# Patient Record
Sex: Female | Born: 1965 | Race: White | Hispanic: No | Marital: Married | Smoking: Never smoker
Health system: Southern US, Community
[De-identification: ages and names within clinical notes are randomized; demographics above are authoritative.]

## PROBLEM LIST (undated history)

## (undated) DIAGNOSIS — R32 Unspecified urinary incontinence: Secondary | ICD-10-CM

## (undated) HISTORY — PX: ABDOMINAL EXPLORATION SURGERY: SHX538

---

## 1999-09-16 ENCOUNTER — Other Ambulatory Visit: Admission: RE | Admit: 1999-09-16 | Discharge: 1999-09-16 | Payer: Self-pay | Admitting: Obstetrics and Gynecology

## 2000-10-12 ENCOUNTER — Other Ambulatory Visit: Admission: RE | Admit: 2000-10-12 | Discharge: 2000-10-12 | Payer: Self-pay | Admitting: Obstetrics and Gynecology

## 2001-11-11 ENCOUNTER — Other Ambulatory Visit: Admission: RE | Admit: 2001-11-11 | Discharge: 2001-11-11 | Payer: Self-pay | Admitting: Obstetrics and Gynecology

## 2002-12-09 ENCOUNTER — Other Ambulatory Visit: Admission: RE | Admit: 2002-12-09 | Discharge: 2002-12-09 | Payer: Self-pay | Admitting: Obstetrics and Gynecology

## 2003-12-27 ENCOUNTER — Other Ambulatory Visit: Admission: RE | Admit: 2003-12-27 | Discharge: 2003-12-27 | Payer: Self-pay | Admitting: Obstetrics and Gynecology

## 2005-02-24 ENCOUNTER — Other Ambulatory Visit: Admission: RE | Admit: 2005-02-24 | Discharge: 2005-02-24 | Payer: Self-pay | Admitting: Obstetrics and Gynecology

## 2007-07-06 ENCOUNTER — Encounter: Admission: RE | Admit: 2007-07-06 | Discharge: 2007-07-06 | Payer: Self-pay | Admitting: Obstetrics and Gynecology

## 2007-12-28 ENCOUNTER — Encounter: Admission: RE | Admit: 2007-12-28 | Discharge: 2007-12-28 | Payer: Self-pay | Admitting: Obstetrics and Gynecology

## 2013-09-14 ENCOUNTER — Other Ambulatory Visit: Payer: Self-pay | Admitting: Obstetrics and Gynecology

## 2013-09-14 DIAGNOSIS — R928 Other abnormal and inconclusive findings on diagnostic imaging of breast: Secondary | ICD-10-CM

## 2013-09-29 ENCOUNTER — Ambulatory Visit
Admission: RE | Admit: 2013-09-29 | Discharge: 2013-09-29 | Disposition: A | Discharge status: Home / Self Care | Payer: 59 | Source: Ambulatory Visit | Attending: Obstetrics and Gynecology | Admitting: Obstetrics and Gynecology

## 2013-09-29 DIAGNOSIS — R928 Other abnormal and inconclusive findings on diagnostic imaging of breast: Secondary | ICD-10-CM

## 2013-10-03 ENCOUNTER — Other Ambulatory Visit: Payer: Self-pay

## 2014-03-28 ENCOUNTER — Encounter (HOSPITAL_BASED_OUTPATIENT_CLINIC_OR_DEPARTMENT_OTHER): Payer: Self-pay | Admitting: *Deleted

## 2014-03-28 NOTE — Progress Notes (Signed)
Pt instructed npo p mn 6/10.  To [facility] 6/11 @ 0600.  Hgb, urine hcg on arrival. Pt aware we're waiting on orders from Dr. Marcelle Overlie and should changes occur she will be notified .  Pt verbalized her understanding.

## 2014-03-28 NOTE — H&P (Signed)
Lisa Esparza  DICTATION #  CSN# [id number]   Meriel Pica, MD 03/28/2014 3:19 PM

## 2014-03-30 NOTE — H&P (Signed)
Lisa Esparza, ERK NO.:  [id number]  MEDICAL RECORD NO.:  [id number]  LOCATION:                                 FACILITY:  PHYSICIAN:  Duke Salvia. Marcelle Overlie, M.D.DATE OF BIRTH:  February 18, 1966  DATE OF ADMISSION:  04/06/2014 DATE OF DISCHARGE:                             HISTORY & PHYSICAL   __________ several years ago, she has began complaining of __________ gradually worsened over time.  She did have studies done in our office that showed definite leaking with a full bladder.  She had a normal low PVR, UA was otherwise clear.  There were no uninhibited contractions, and she had LPP that was in about the 40-50 range.  She is now for outpatient mid-urethral sling.  We have had extensive discussions regarding specific risks of infection, adjacent organ injury, postoperative bladder irritability changes that can occur and also a specific risks related to mesh exposure and how it is treated.  All of her questions have been answered and documented in our office informed consent.  ALLERGIES:  PENICILLIN and SULFA.  OBSTETRICAL HISTORY:  She has had 1 ectopic pregnancy __________ otherwise negative.  __________ significant for heart disease.  SOCIAL HISTORY:  Denies alcohol, tobacco, or drug use.  She is married.  PHYSICAL EXAMINATION:  VITAL SIGNS:  Temp 98.2, blood pressure 120/72. HEENT: __________. NECK:  Supple without masses. LUNGS:  Clear. CARDIOVASCULAR:  Regular rate and rhythm without murmurs, rubs, or gallops. BREASTS:  Without masses. ABDOMEN:  Soft, flat, and nontender.  Vulva, vagina and cervix normal. Uterus mid position, normal size.  Adnexa negative. EXTREMITIES:  Unremarkable. NEUROLOGIC:  Unremarkable.  IMPRESSION:  Stress urinary incontinence __________ urethral sling. Procedure and risks were discussed __________.     Rusty Glodowski M. Marcelle Overlie, M.D.    [facility]/MEDQ  D:  03/28/2014  T:  03/29/2014  Job:  [id number]

## 2014-04-06 ENCOUNTER — Encounter (HOSPITAL_BASED_OUTPATIENT_CLINIC_OR_DEPARTMENT_OTHER): Payer: Self-pay | Admitting: *Deleted

## 2014-04-06 ENCOUNTER — Encounter (HOSPITAL_BASED_OUTPATIENT_CLINIC_OR_DEPARTMENT_OTHER)
Admission: RE | Disposition: A | Discharge status: Home / Self Care | Payer: Self-pay | Source: Ambulatory Visit | Attending: Obstetrics and Gynecology

## 2014-04-06 ENCOUNTER — Ambulatory Visit (HOSPITAL_BASED_OUTPATIENT_CLINIC_OR_DEPARTMENT_OTHER): Payer: 59 | Admitting: Anesthesiology

## 2014-04-06 ENCOUNTER — Ambulatory Visit (HOSPITAL_BASED_OUTPATIENT_CLINIC_OR_DEPARTMENT_OTHER)
Admission: RE | Admit: 2014-04-06 | Discharge: 2014-04-06 | Disposition: A | Discharge status: Home / Self Care | Payer: 59 | Source: Ambulatory Visit | Attending: Obstetrics and Gynecology | Admitting: Obstetrics and Gynecology

## 2014-04-06 ENCOUNTER — Encounter (HOSPITAL_BASED_OUTPATIENT_CLINIC_OR_DEPARTMENT_OTHER): Payer: 59 | Admitting: Anesthesiology

## 2014-04-06 DIAGNOSIS — N393 Stress incontinence (female) (male): Secondary | ICD-10-CM | POA: Insufficient documentation

## 2014-04-06 HISTORY — PX: PUBOVAGINAL SLING: SHX1035

## 2014-04-06 HISTORY — DX: Unspecified urinary incontinence: R32

## 2014-04-06 LAB — CBC
HCT: 35.3 % — ABNORMAL LOW (ref 36.0–46.0)
Hemoglobin: 11 g/dL — ABNORMAL LOW (ref 12.0–15.0)
MCH: 24.2 pg — AB (ref 26.0–34.0)
MCHC: 31.2 g/dL (ref 30.0–36.0)
MCV: 77.8 fL — AB (ref 78.0–100.0)
PLATELETS: 385 10*3/uL (ref 150–400)
RBC: 4.54 MIL/uL (ref 3.87–5.11)
RDW: 17 % — AB (ref 11.5–15.5)
WBC: 5.7 10*3/uL (ref 4.0–10.5)

## 2014-04-06 LAB — POCT PREGNANCY, URINE: Preg Test, Ur: NEGATIVE

## 2014-04-06 SURGERY — CREATION, PUBOVAGINAL SLING
Anesthesia: General | Site: Vagina

## 2014-04-06 MED ORDER — MIDAZOLAM HCL 2 MG/2ML IJ SOLN
INTRAMUSCULAR | Status: AC
  Filled 2014-04-06: qty 2

## 2014-04-06 MED ORDER — CIPROFLOXACIN IN D5W 400 MG/200ML IV SOLN
400.0000 mg | INTRAVENOUS | Status: AC
  Administered 2014-04-06: 400 mg via INTRAVENOUS
  Filled 2014-04-06: qty 200

## 2014-04-06 MED ORDER — ONDANSETRON HCL 4 MG/2ML IJ SOLN
INTRAMUSCULAR | Status: DC | PRN
  Administered 2014-04-06: 4 mg via INTRAVENOUS

## 2014-04-06 MED ORDER — LIDOCAINE HCL (CARDIAC) 20 MG/ML IV SOLN
INTRAVENOUS | Status: DC | PRN
  Administered 2014-04-06: 60 mg via INTRAVENOUS

## 2014-04-06 MED ORDER — LIDOCAINE-EPINEPHRINE 1 %-1:100000 IJ SOLN
INTRAMUSCULAR | Status: DC | PRN
  Administered 2014-04-06: 7 mL

## 2014-04-06 MED ORDER — FENTANYL CITRATE 0.05 MG/ML IJ SOLN
INTRAMUSCULAR | Status: DC | PRN
  Administered 2014-04-06: 25 ug via INTRAVENOUS
  Administered 2014-04-06: 50 ug via INTRAVENOUS
  Administered 2014-04-06: 25 ug via INTRAVENOUS

## 2014-04-06 MED ORDER — METRONIDAZOLE IN NACL 5-0.79 MG/ML-% IV SOLN
500.0000 mg | INTRAVENOUS | Status: AC
  Administered 2014-04-06: .5 g via INTRAVENOUS
  Filled 2014-04-06: qty 100

## 2014-04-06 MED ORDER — LACTATED RINGERS IV SOLN
INTRAVENOUS | Status: DC
  Administered 2014-04-06: 06:00:00 via INTRAVENOUS
  Filled 2014-04-06: qty 1000

## 2014-04-06 MED ORDER — STERILE WATER FOR IRRIGATION IR SOLN
Status: DC | PRN
  Administered 2014-04-06: 3000 mL
  Administered 2014-04-06: 500 mL

## 2014-04-06 MED ORDER — PROPOFOL 10 MG/ML IV BOLUS
INTRAVENOUS | Status: DC | PRN
  Administered 2014-04-06: 180 mg via INTRAVENOUS

## 2014-04-06 MED ORDER — FENTANYL CITRATE 0.05 MG/ML IJ SOLN
INTRAMUSCULAR | Status: AC
  Filled 2014-04-06: qty 6

## 2014-04-06 MED ORDER — MIDAZOLAM HCL 5 MG/5ML IJ SOLN
INTRAMUSCULAR | Status: DC | PRN
  Administered 2014-04-06: 2 mg via INTRAVENOUS

## 2014-04-06 MED ORDER — KETOROLAC TROMETHAMINE 30 MG/ML IJ SOLN
INTRAMUSCULAR | Status: DC | PRN
  Administered 2014-04-06: 30 mg via INTRAVENOUS

## 2014-04-06 MED ORDER — HYDROCODONE-IBUPROFEN 7.5-200 MG PO TABS: 1.0000 | ORAL_TABLET | Freq: Three times a day (TID) | ORAL | Status: AC | PRN

## 2014-04-06 MED ORDER — DEXAMETHASONE SODIUM PHOSPHATE 4 MG/ML IJ SOLN
INTRAMUSCULAR | Status: DC | PRN
  Administered 2014-04-06: 10 mg via INTRAVENOUS

## 2014-04-06 SURGICAL SUPPLY — 40 items
ADH SKN CLS APL DERMABOND .7 (GAUZE/BANDAGES/DRESSINGS) ×1
BAG URINE DRAINAGE (UROLOGICAL SUPPLIES) ×3 IMPLANT
BAG URO CATCHER STRL LF (DRAPE) ×3 IMPLANT
BLADE SURG 15 STRL LF DISP TIS (BLADE) IMPLANT
BLADE SURG 15 STRL SS (BLADE)
CANISTER SUCT 3000ML (MISCELLANEOUS) ×3 IMPLANT
CATH FOLEY 2WAY SLVR  5CC 14FR (CATHETERS) ×2
CATH FOLEY 2WAY SLVR 5CC 14FR (CATHETERS) ×1 IMPLANT
CATH ROBINSON RED A/P 16FR (CATHETERS) ×2 IMPLANT
DERMABOND ADVANCED (GAUZE/BANDAGES/DRESSINGS) ×2
DERMABOND ADVANCED .7 DNX12 (GAUZE/BANDAGES/DRESSINGS) IMPLANT
DRAPE CAMERA CLOSED 9X96 (DRAPES) ×3 IMPLANT
DRAPE LG THREE QUARTER DISP (DRAPES) ×3 IMPLANT
ELECT REM PT RETURN 9FT ADLT (ELECTROSURGICAL) ×3
ELECTRODE REM PT RTRN 9FT ADLT (ELECTROSURGICAL) ×1 IMPLANT
GLOVE BIO SURGEON STRL SZ7 (GLOVE) ×5 IMPLANT
GLOVE BIOGEL PI IND STRL 7.5 (GLOVE) IMPLANT
GLOVE BIOGEL PI INDICATOR 7.5 (GLOVE) ×4
GLOVE SURG SS PI 7.5 STRL IVOR (GLOVE) ×2 IMPLANT
GOWN STRL REUS W/TWL LRG LVL3 (GOWN DISPOSABLE) ×2 IMPLANT
GOWN STRL REUS W/TWL XL LVL3 (GOWN DISPOSABLE) ×2 IMPLANT
HOLDER FOLEY CATH W/STRAP (MISCELLANEOUS) ×3 IMPLANT
NDL HYPO 25X1 1.5 SAFETY (NEEDLE) ×1 IMPLANT
NEEDLE HYPO 25X1 1.5 SAFETY (NEEDLE) ×3 IMPLANT
NS IRRIG 500ML POUR BTL (IV SOLUTION) ×3 IMPLANT
PACK BASIN DAY SURGERY FS (CUSTOM PROCEDURE TRAY) ×3 IMPLANT
PACK CYSTOSCOPY (CUSTOM PROCEDURE TRAY) ×3 IMPLANT
PACKING VAGINAL (PACKING) IMPLANT
PAD OB MATERNITY 4.3X12.25 (PERSONAL CARE ITEMS) ×3 IMPLANT
PENCIL BUTTON HOLSTER BLD 10FT (ELECTRODE) ×3 IMPLANT
SLING SOLYX SYSTEM SIS EA (Sling) ×2 IMPLANT
SUT VIC AB 2-0 UR6 27 (SUTURE) ×6 IMPLANT
SYR CONTROL 10ML LL (SYRINGE) ×3 IMPLANT
SYRINGE 10CC LL (SYRINGE) ×3 IMPLANT
TOWEL OR 17X24 6PK STRL BLUE (TOWEL DISPOSABLE) ×6 IMPLANT
TUBE CONNECTING 12'X1/4 (SUCTIONS) ×1
TUBE CONNECTING 12X1/4 (SUCTIONS) ×2 IMPLANT
WATER STERILE IRR 3000ML UROMA (IV SOLUTION) ×3 IMPLANT
WATER STERILE IRR 500ML POUR (IV SOLUTION) ×3 IMPLANT
YANKAUER SUCT BULB TIP NO VENT (SUCTIONS) ×3 IMPLANT

## 2014-04-06 NOTE — Progress Notes (Signed)
The patient was re-examined with no change in status 

## 2014-04-06 NOTE — Anesthesia Procedure Notes (Signed)
Procedure Name: LMA Insertion Date/Time: 04/06/2014 7:24 AM Performed by: Renella Cunas D Pre-anesthesia Checklist: Patient identified, Emergency Drugs available, Suction available and Patient being monitored Patient Re-evaluated:Patient Re-evaluated prior to inductionOxygen Delivery Method: Circle System Utilized Preoxygenation: Pre-oxygenation with 100% oxygen Intubation Type: IV induction Ventilation: Mask ventilation without difficulty LMA: LMA inserted LMA Size: 4.0 Number of attempts: 1 Airway Equipment and Method: bite block Placement Confirmation: positive ETCO2 Tube secured with: Tape Dental Injury: Teeth and Oropharynx as per pre-operative assessment

## 2014-04-06 NOTE — Anesthesia Preprocedure Evaluation (Addendum)
Anesthesia Evaluation  Patient identified by MRN, date of birth, ID band Patient awake    Reviewed: Allergy & Precautions, H&P , NPO status , Patient's Chart, lab work & pertinent test results  Airway Mallampati: II TM Distance: >3 FB Neck ROM: Full    Dental no notable dental hx.    Pulmonary neg pulmonary ROS,  breath sounds clear to auscultation  Pulmonary exam normal       Cardiovascular negative cardio ROS  IRhythm:Regular Rate:Normal     Neuro/Psych negative neurological ROS  negative psych ROS   GI/Hepatic negative GI ROS, Neg liver ROS,   Endo/Other  negative endocrine ROS  Renal/GU negative Renal ROS  negative genitourinary   Musculoskeletal negative musculoskeletal ROS (+)   Abdominal   Peds negative pediatric ROS (+)  Hematology negative hematology ROS (+)   Anesthesia Other Findings   Reproductive/Obstetrics negative OB ROS                          Anesthesia Physical Anesthesia Plan  ASA: I  Anesthesia Plan: General   Post-op Pain Management:    Induction: Intravenous  Airway Management Planned: LMA  Additional Equipment:   Intra-op Plan:   Post-operative Plan: Extubation in OR  Informed Consent: I have reviewed the patients History and Physical, chart, labs and discussed the procedure including the risks, benefits and alternatives for the proposed anesthesia with the patient or authorized representative who has indicated his/her understanding and acceptance.   Dental advisory given  Plan Discussed with: CRNA  Anesthesia Plan Comments:        Anesthesia Quick Evaluation

## 2014-04-06 NOTE — Discharge Instructions (Signed)
°  Post Anesthesia Home Care Instructions ° °Activity: °Get plenty of rest for the remainder of the day. A responsible adult should stay with you for 24 hours following the procedure.  °For the next 24 hours, DO NOT: °-Drive a car °-Operate machinery °-Drink alcoholic beverages °-Take any medication unless instructed by your physician °-Make any legal decisions or sign important papers. ° °Meals: °Start with liquid foods such as gelatin or soup. Progress to regular foods as tolerated. Avoid greasy, spicy, heavy foods. If nausea and/or vomiting occur, drink only clear liquids until the nausea and/or vomiting subsides. Call your physician if vomiting continues. ° °Special Instructions/Symptoms: °Your throat may feel dry or sore from the anesthesia or the breathing tube placed in your throat during surgery. If this causes discomfort, gargle with warm salt water. The discomfort should disappear within 24 hours. ° °HOME CARE INSTRUCTIONS FOR VAGINAL SLING ° °Activity: ° -No lifting greater than 10-15 pounds for  *** weeks, or as instructed by your                        physician. ° -No driving a car for one week. ° -No sexual intercourse for  *** weeks. ° °Diet: ° You may return to your normal diet tomorrow.   It is important to keep your       bowels regular during the postoperative period.  To avoid constipation, drink plenty of fluids during the day (8-10 glasses) and eat plenty of fresh fruits and vegetables.  Use a mild laxative or stool softener if necessary. ° °Wound Care: ° You may begin showering in  *** days, or as instructed by your physician.      °Return to Work as instructed by your physician.   ° °Special Instructions: ° ° Call your physician if any of these symptoms occur: °  -temperature greater than 101 degrees Farenheit. °  -redness, swelling or drainage at incision site. °  -foul odor of your urine. °  -a significant decrease in the amount of urine you have every day. °  -severe pain not relieved by  your pain medication. ° ° ° °Return to see your doctor in *** weeks. °Call to set up a follow-up appointment. ° °Patient Signature:  ________________________________________________________ ° °Nurse's Signature:  ________________________________________________________ °

## 2014-04-06 NOTE — Addendum Note (Signed)
Addendum created 04/06/14 1220 by Phillips Grout, MD   Modules edited: Anesthesia Review and Sign Navigator Section, Clinical Notes, Orders   Clinical Notes:  File: [id number]

## 2014-04-06 NOTE — Op Note (Signed)
Preoperative diagnosis: Stress urinary incontinence  Postoperative diagnosis: Same  Procedure single incision, mid urethral sling, cystoscopy  Surgeon: Marcelle Overlie  Anesthesia: Gen., LMA  EBL: Less than 50 cc  Specimens removed: None  Drains: Foley catheter, to straight drain  Procedure and findings:  The patient taken the operating room after an adequate level of general anesthesia with LMA was obtained, with the legs in stirrups the perineum and vagina were prepped and draped in the usual manner for vaginal procedures. Bladder was drained appropriate timeout for taken at that point. Weighted speculum was positioned, the mid urethral area was grasped with 2 Allis clamps, 1% Xylocaine with dilute epinephrine was then used to inject this area and the track area for the sling. A vertical 2-1/2 cm incision was then made, Metzenbaum scissors were then used to dissect the periurethral area. Surgeon finger dissection was then used to further dissected and palpate the posterior surface of the inferior pubic ramus on each side. The mid urethral sling was loaded, starting on the right was positioned posterior to the inferior pubic ramus at a 45 angle to the halfway point and the anchor was released. This is reloaded on the opposite side in similar fashion the sling was positioned at a 45 angle posterior to the inferior pubic ramus, appropriate tensioning was ascertained and the anchor was released. Cystoscopy was carried out revealing the bladder to be unremarkable as was the urethra. The cystoscope was removed Foley catheter positioned draining clear urine. Small incision was closed with interrupted 2-0 Vicryl sutures. She tolerated this well went to recovery room in good condition.  Dictated with [facility]  Kaladin Noseworthy M. Milana Obey.D.

## 2014-04-06 NOTE — Transfer of Care (Signed)
Immediate Anesthesia Transfer of Care Note  Patient: Lisa Esparza  Procedure(s) Performed: Procedure(s) (LRB): solyx single incision sling Conservation officer, historic buildings) (N/A)  Patient Location: PACU  Anesthesia Type: General  Level of Consciousness: awake, oriented, sedated and patient cooperative  Airway & Oxygen Therapy: Patient Spontanous Breathing and Patient connected to face mask oxygen  Post-op Assessment: Report given to PACU RN and Post -op Vital signs reviewed and stable  Post vital signs: Reviewed and stable  Complications: No apparent anesthesia complications

## 2014-04-06 NOTE — Anesthesia Postprocedure Evaluation (Signed)
  Anesthesia Post-op Note  Patient: Lisa Esparza  Procedure(s) Performed: Procedure(s) (LRB): solyx single incision sling (N/A)  Patient Location: PACU  Anesthesia Type: General  Level of Consciousness: awake and alert   Airway and Oxygen Therapy: Patient Spontanous Breathing  Post-op Pain: mild  Post-op Assessment: Post-op Vital signs reviewed, Patient's Cardiovascular Status Stable, Respiratory Function Stable, Patent Airway and No signs of Nausea or vomiting  Last Vitals:  Filed Vitals:   04/06/14 0915  BP: 115/78  Pulse: 82  Temp: 36 C  Resp: 16    Post-op Vital Signs: stable   Complications: No apparent anesthesia complications

## 2014-04-07 ENCOUNTER — Encounter (HOSPITAL_BASED_OUTPATIENT_CLINIC_OR_DEPARTMENT_OTHER): Payer: Self-pay | Admitting: Obstetrics and Gynecology

## 2015-10-11 ENCOUNTER — Other Ambulatory Visit: Payer: Self-pay | Admitting: Obstetrics and Gynecology

## 2015-10-11 DIAGNOSIS — R928 Other abnormal and inconclusive findings on diagnostic imaging of breast: Secondary | ICD-10-CM

## 2015-10-12 ENCOUNTER — Ambulatory Visit
Admission: RE | Admit: 2015-10-12 | Discharge: 2015-10-12 | Disposition: A | Discharge status: Home / Self Care | Payer: 59 | Source: Ambulatory Visit | Attending: Obstetrics and Gynecology | Admitting: Obstetrics and Gynecology

## 2015-10-12 DIAGNOSIS — R928 Other abnormal and inconclusive findings on diagnostic imaging of breast: Secondary | ICD-10-CM

## 2015-10-31 ENCOUNTER — Ambulatory Visit
Admission: RE | Admit: 2015-10-31 | Discharge: 2015-10-31 | Disposition: A | Discharge status: Home / Self Care | Payer: 59 | Source: Ambulatory Visit | Attending: Family Medicine | Admitting: Family Medicine

## 2015-10-31 ENCOUNTER — Other Ambulatory Visit: Payer: Self-pay | Admitting: Family Medicine

## 2015-10-31 DIAGNOSIS — M25511 Pain in right shoulder: Secondary | ICD-10-CM

## 2016-12-18 DIAGNOSIS — Z1231 Encounter for screening mammogram for malignant neoplasm of breast: Secondary | ICD-10-CM | POA: Diagnosis not present

## 2016-12-31 DIAGNOSIS — Z01419 Encounter for gynecological examination (general) (routine) without abnormal findings: Secondary | ICD-10-CM | POA: Diagnosis not present

## 2017-09-30 DIAGNOSIS — Z0000 Encounter for general adult medical examination without abnormal findings: Secondary | ICD-10-CM | POA: Diagnosis not present

## 2017-09-30 DIAGNOSIS — Z13220 Encounter for screening for lipoid disorders: Secondary | ICD-10-CM | POA: Diagnosis not present

## 2018-01-19 DIAGNOSIS — Z01419 Encounter for gynecological examination (general) (routine) without abnormal findings: Secondary | ICD-10-CM | POA: Diagnosis not present

## 2018-07-13 ENCOUNTER — Other Ambulatory Visit: Payer: Self-pay | Admitting: Physician Assistant

## 2018-07-13 DIAGNOSIS — Z23 Encounter for immunization: Secondary | ICD-10-CM | POA: Diagnosis not present

## 2018-07-13 DIAGNOSIS — N644 Mastodynia: Secondary | ICD-10-CM

## 2018-07-16 ENCOUNTER — Other Ambulatory Visit: Payer: Self-pay | Admitting: Physician Assistant

## 2018-07-16 ENCOUNTER — Ambulatory Visit
Admission: RE | Admit: 2018-07-16 | Discharge: 2018-07-16 | Disposition: A | Discharge status: Home / Self Care | Payer: 59 | Source: Ambulatory Visit | Attending: Physician Assistant | Admitting: Physician Assistant

## 2018-07-16 DIAGNOSIS — N641 Fat necrosis of breast: Secondary | ICD-10-CM

## 2018-07-16 DIAGNOSIS — N644 Mastodynia: Secondary | ICD-10-CM

## 2018-07-16 DIAGNOSIS — N6320 Unspecified lump in the left breast, unspecified quadrant: Secondary | ICD-10-CM

## 2018-07-16 DIAGNOSIS — N6489 Other specified disorders of breast: Secondary | ICD-10-CM | POA: Diagnosis not present

## 2018-07-16 DIAGNOSIS — R922 Inconclusive mammogram: Secondary | ICD-10-CM | POA: Diagnosis not present

## 2018-10-26 DIAGNOSIS — Z13220 Encounter for screening for lipoid disorders: Secondary | ICD-10-CM | POA: Diagnosis not present

## 2018-10-26 DIAGNOSIS — Z0000 Encounter for general adult medical examination without abnormal findings: Secondary | ICD-10-CM | POA: Diagnosis not present

## 2019-01-21 ENCOUNTER — Other Ambulatory Visit: Payer: 59

## 2019-02-23 ENCOUNTER — Other Ambulatory Visit: Payer: Self-pay

## 2019-02-23 ENCOUNTER — Inpatient Hospital Stay
Admission: RE | Admit: 2019-02-23 | Discharge: 2019-02-23 | Disposition: A | Discharge status: Home / Self Care | Payer: Self-pay | Source: Ambulatory Visit | Attending: Physician Assistant | Admitting: Physician Assistant

## 2019-03-10 ENCOUNTER — Ambulatory Visit
Admission: RE | Admit: 2019-03-10 | Discharge: 2019-03-10 | Disposition: A | Discharge status: Home / Self Care | Payer: 59 | Source: Ambulatory Visit | Attending: Physician Assistant | Admitting: Physician Assistant

## 2019-03-10 ENCOUNTER — Other Ambulatory Visit: Payer: Self-pay

## 2019-03-10 DIAGNOSIS — N641 Fat necrosis of breast: Secondary | ICD-10-CM

## 2019-03-10 DIAGNOSIS — N644 Mastodynia: Secondary | ICD-10-CM | POA: Diagnosis not present

## 2020-03-22 ENCOUNTER — Other Ambulatory Visit: Payer: Self-pay | Admitting: Obstetrics and Gynecology

## 2020-03-22 ENCOUNTER — Other Ambulatory Visit: Payer: Self-pay | Admitting: Physician Assistant

## 2020-03-22 DIAGNOSIS — Z1231 Encounter for screening mammogram for malignant neoplasm of breast: Secondary | ICD-10-CM

## 2020-04-03 ENCOUNTER — Ambulatory Visit
Admission: RE | Admit: 2020-04-03 | Discharge: 2020-04-03 | Disposition: A | Discharge status: Home / Self Care | Payer: 59 | Source: Ambulatory Visit | Attending: Obstetrics and Gynecology | Admitting: Obstetrics and Gynecology

## 2020-04-03 ENCOUNTER — Other Ambulatory Visit: Payer: Self-pay

## 2020-04-03 DIAGNOSIS — Z1231 Encounter for screening mammogram for malignant neoplasm of breast: Secondary | ICD-10-CM

## 2021-04-03 ENCOUNTER — Other Ambulatory Visit: Payer: Self-pay | Admitting: Obstetrics and Gynecology

## 2021-04-03 DIAGNOSIS — Z1231 Encounter for screening mammogram for malignant neoplasm of breast: Secondary | ICD-10-CM

## 2021-04-04 ENCOUNTER — Other Ambulatory Visit: Payer: Self-pay

## 2021-04-04 ENCOUNTER — Ambulatory Visit (INDEPENDENT_AMBULATORY_CARE_PROVIDER_SITE_OTHER): Payer: 59

## 2021-04-04 DIAGNOSIS — Z1231 Encounter for screening mammogram for malignant neoplasm of breast: Secondary | ICD-10-CM | POA: Diagnosis not present

## 2022-04-01 ENCOUNTER — Other Ambulatory Visit: Payer: Self-pay | Admitting: Advanced Practice Midwife

## 2022-04-01 DIAGNOSIS — Z1231 Encounter for screening mammogram for malignant neoplasm of breast: Secondary | ICD-10-CM

## 2022-04-10 ENCOUNTER — Ambulatory Visit (INDEPENDENT_AMBULATORY_CARE_PROVIDER_SITE_OTHER): Payer: 59

## 2022-04-10 DIAGNOSIS — Z1231 Encounter for screening mammogram for malignant neoplasm of breast: Secondary | ICD-10-CM | POA: Diagnosis not present

## 2022-12-10 ENCOUNTER — Other Ambulatory Visit: Payer: Self-pay | Admitting: Family Medicine

## 2022-12-10 DIAGNOSIS — N6320 Unspecified lump in the left breast, unspecified quadrant: Secondary | ICD-10-CM

## 2022-12-18 ENCOUNTER — Ambulatory Visit
Admission: RE | Admit: 2022-12-18 | Discharge: 2022-12-18 | Disposition: A | Discharge status: Home / Self Care | Payer: 59 | Source: Ambulatory Visit | Attending: Family Medicine | Admitting: Family Medicine

## 2022-12-18 DIAGNOSIS — N6320 Unspecified lump in the left breast, unspecified quadrant: Secondary | ICD-10-CM

## 2023-03-16 ENCOUNTER — Other Ambulatory Visit: Payer: Self-pay | Admitting: Family Medicine

## 2023-03-16 DIAGNOSIS — Z1231 Encounter for screening mammogram for malignant neoplasm of breast: Secondary | ICD-10-CM

## 2023-03-24 ENCOUNTER — Other Ambulatory Visit: Payer: Self-pay

## 2023-03-24 ENCOUNTER — Encounter (HOSPITAL_BASED_OUTPATIENT_CLINIC_OR_DEPARTMENT_OTHER): Payer: Self-pay

## 2023-03-24 ENCOUNTER — Emergency Department (HOSPITAL_BASED_OUTPATIENT_CLINIC_OR_DEPARTMENT_OTHER): Payer: 59

## 2023-03-24 DIAGNOSIS — J069 Acute upper respiratory infection, unspecified: Secondary | ICD-10-CM | POA: Insufficient documentation

## 2023-03-24 DIAGNOSIS — R000 Tachycardia, unspecified: Secondary | ICD-10-CM | POA: Insufficient documentation

## 2023-03-24 DIAGNOSIS — Z1152 Encounter for screening for COVID-19: Secondary | ICD-10-CM | POA: Diagnosis not present

## 2023-03-24 DIAGNOSIS — R059 Cough, unspecified: Secondary | ICD-10-CM | POA: Diagnosis present

## 2023-03-24 LAB — SARS CORONAVIRUS 2 BY RT PCR: SARS Coronavirus 2 by RT PCR: NEGATIVE

## 2023-03-24 MED ORDER — ALBUTEROL SULFATE HFA 108 (90 BASE) MCG/ACT IN AERS: 2.0000 | INHALATION_SPRAY | RESPIRATORY_TRACT | Status: DC | PRN

## 2023-03-24 NOTE — ED Triage Notes (Signed)
Pt complaining of cough and cold symptoms since Thursday. Has been taking robitussin and mucinex dm. Now the coughing fits take her breath and it hurts in the chest.

## 2023-03-25 ENCOUNTER — Emergency Department (HOSPITAL_BASED_OUTPATIENT_CLINIC_OR_DEPARTMENT_OTHER)
Admission: EM | Admit: 2023-03-25 | Discharge: 2023-03-25 | Disposition: A | Discharge status: Home / Self Care | Payer: 59 | Attending: Emergency Medicine | Admitting: Emergency Medicine

## 2023-03-25 DIAGNOSIS — J069 Acute upper respiratory infection, unspecified: Secondary | ICD-10-CM

## 2023-03-25 LAB — BASIC METABOLIC PANEL
Anion gap: 8 (ref 5–15)
BUN: 13 mg/dL (ref 6–20)
CO2: 26 mmol/L (ref 22–32)
Calcium: 8.8 mg/dL — ABNORMAL LOW (ref 8.9–10.3)
Chloride: 104 mmol/L (ref 98–111)
Creatinine, Ser: 0.77 mg/dL (ref 0.44–1.00)
GFR, Estimated: >60 mL/min (ref >60)
Glucose, Bld: 116 mg/dL — ABNORMAL HIGH (ref 70–99)
Potassium: 4.1 mmol/L (ref 3.5–5.1)
Sodium: 138 mmol/L (ref 135–145)

## 2023-03-25 LAB — CBC WITH DIFFERENTIAL/PLATELET
Abs Immature Granulocytes: 0.01 10*3/uL (ref 0.00–0.07)
Basophils Absolute: 0 10*3/uL (ref 0.0–0.1)
Basophils Relative: 0 %
Eosinophils Absolute: 0.1 10*3/uL (ref 0.0–0.5)
Eosinophils Relative: 2 %
HCT: 39.1 % (ref 36.0–46.0)
Hemoglobin: 12.9 g/dL (ref 12.0–15.0)
Immature Granulocytes: 0 %
Lymphocytes Relative: 33 %
Lymphs Abs: 2.3 10*3/uL (ref 0.7–4.0)
MCH: 28.2 pg (ref 26.0–34.0)
MCHC: 33 g/dL (ref 30.0–36.0)
MCV: 85.6 fL (ref 80.0–100.0)
Monocytes Absolute: 0.3 10*3/uL (ref 0.1–1.0)
Monocytes Relative: 4 %
Neutro Abs: 4.1 10*3/uL (ref 1.7–7.7)
Neutrophils Relative %: 61 %
Platelets: 276 10*3/uL (ref 150–400)
RBC: 4.57 MIL/uL (ref 3.87–5.11)
RDW: 13.4 % (ref 11.5–15.5)
WBC: 6.8 10*3/uL (ref 4.0–10.5)
nRBC: 0 % (ref 0.0–0.2)

## 2023-03-25 LAB — TROPONIN I (HIGH SENSITIVITY): Troponin I (High Sensitivity): 2 ng/L (ref <18)

## 2023-03-25 MED ORDER — LORATADINE 10 MG PO TABS
10.0000 mg | ORAL_TABLET | Freq: Once | ORAL | Status: AC
  Administered 2023-03-25: 10 mg via ORAL
  Filled 2023-03-25: qty 1

## 2023-03-25 MED ORDER — BENZONATATE 100 MG PO CAPS
200.0000 mg | ORAL_CAPSULE | Freq: Once | ORAL | Status: AC
  Administered 2023-03-25: 200 mg via ORAL
  Filled 2023-03-25: qty 2

## 2023-03-25 MED ORDER — PREDNISONE 50 MG PO TABS
60.0000 mg | ORAL_TABLET | Freq: Once | ORAL | Status: AC
  Administered 2023-03-25: 60 mg via ORAL
  Filled 2023-03-25: qty 1

## 2023-03-25 MED ORDER — MOMETASONE FUROATE 50 MCG/ACT NA SUSP: 2.0000 | Freq: Every day | NASAL | 0 refills | Status: AC

## 2023-03-25 MED ORDER — PREDNISONE 10 MG PO TABS: 20.0000 mg | ORAL_TABLET | Freq: Every day | ORAL | 0 refills | Status: AC

## 2023-03-25 NOTE — ED Provider Notes (Signed)
[place] [facility] Provider Note   CSN: [id number] Arrival date & time: 03/24/23  2033     History  Chief Complaint  Patient presents with   Cough    Lisa Esparza is a 57 y.o. female.  The history is provided by the patient.  Cough Cough characteristics:  Non-productive Severity:  Moderate Onset quality:  Gradual Duration:  7 days Timing:  Intermittent Progression:  Unchanged Chronicity:  New Context: upper respiratory infection   Relieved by:  Nothing Worsened by:  Nothing Ineffective treatments:  None tried Associated symptoms: sinus congestion   Associated symptoms: no chest pain, no fever and no wheezing   Risk factors: no chemical exposure   Patient with cough and congestion since Wednesday.  No fevers.  Hurts to cough.  No fevers, no SOB.  No leg pain no travel,    Past Medical History:  Diagnosis Date   Incontinence of urine       Home Medications Prior to Admission medications   Medication Sig Start Date End Date Taking? Authorizing Provider  mometasone (NASONEX) 50 MCG/ACT nasal spray Place 2 sprays into the nose daily. 03/25/23  Yes Clayten Allcock, MD  predniSONE (DELTASONE) 10 MG tablet Take 2 tablets (20 mg total) by mouth daily. 03/25/23  Yes Kaiser Belluomini, MD  HYDROcodone-ibuprofen (VICOPROFEN) 7.5-200 MG per tablet Take 1 tablet by mouth every 8 (eight) hours as needed for moderate pain. 04/06/14   Richarda Overlie, MD      Allergies    Penicillins, Sulfa antibiotics, and Watermelon [citrullus vulgaris]    Review of Systems   Review of Systems  Constitutional:  Negative for fever.  HENT:  Positive for congestion.   Eyes:  Negative for redness.  Respiratory:  Positive for cough. Negative for wheezing.   Cardiovascular:  Negative for chest pain, palpitations and leg swelling.  Gastrointestinal:  Negative for vomiting.  All other systems reviewed and are negative.   Physical Exam Updated Vital  Signs BP 138/73   Pulse 92   Temp 98.4 F (36.9 C)   Resp 18   Ht 5\' 10"  (1.778 m)   Wt 92.1 kg   LMP 03/04/2014   SpO2 98%   BMI 29.13 kg/m  Physical Exam Vitals and nursing note reviewed.  Constitutional:      General: She is not in acute distress.    Appearance: Normal appearance. She is well-developed.  HENT:     Head: Normocephalic and atraumatic.     Nose: Congestion present.     Mouth/Throat:     Pharynx: No oropharyngeal exudate.  Eyes:     Pupils: Pupils are equal, round, and reactive to light.  Cardiovascular:     Rate and Rhythm: Normal rate and regular rhythm.     Pulses: Normal pulses.     Heart sounds: Normal heart sounds.  Pulmonary:     Effort: Pulmonary effort is normal. No respiratory distress.     Breath sounds: Normal breath sounds.  Abdominal:     General: Bowel sounds are normal. There is no distension.     Palpations: Abdomen is soft.     Tenderness: There is no abdominal tenderness. There is no guarding or rebound.  Genitourinary:    Vagina: No vaginal discharge.  Musculoskeletal:        General: Normal range of motion.     Cervical back: Normal range of motion and neck supple.  Skin:    General: Skin is warm and  dry.     Capillary Refill: Capillary refill takes less than 2 seconds.     Findings: No erythema or rash.  Neurological:     General: No focal deficit present.     Mental Status: She is alert and oriented to person, place, and time.     Deep Tendon Reflexes: Reflexes normal.  Psychiatric:        Mood and Affect: Mood normal.     ED Results / Procedures / Treatments   Labs (all labs ordered are listed, but only abnormal results are displayed) Results for orders placed or performed during the hospital encounter of 03/25/23  SARS Coronavirus 2 by RT PCR (hospital order, performed in [facility] hospital lab) *cepheid single result test* Anterior Nasal Swab   Specimen: Anterior Nasal Swab  Result Value Ref Range   SARS  Coronavirus 2 by RT PCR NEGATIVE NEGATIVE  CBC with Differential  Result Value Ref Range   WBC 6.8 4.0 - 10.5 K/uL   RBC 4.57 3.87 - 5.11 MIL/uL   Hemoglobin 12.9 12.0 - 15.0 g/dL   HCT 16.1 09.6 - 04.5 %   MCV 85.6 80.0 - 100.0 fL   MCH 28.2 26.0 - 34.0 pg   MCHC 33.0 30.0 - 36.0 g/dL   RDW 40.9 81.1 - 91.4 %   Platelets 276 150 - 400 K/uL   nRBC 0.0 0.0 - 0.2 %   Neutrophils Relative % 61 %   Neutro Abs 4.1 1.7 - 7.7 K/uL   Lymphocytes Relative 33 %   Lymphs Abs 2.3 0.7 - 4.0 K/uL   Monocytes Relative 4 %   Monocytes Absolute 0.3 0.1 - 1.0 K/uL   Eosinophils Relative 2 %   Eosinophils Absolute 0.1 0.0 - 0.5 K/uL   Basophils Relative 0 %   Basophils Absolute 0.0 0.0 - 0.1 K/uL   Immature Granulocytes 0 %   Abs Immature Granulocytes 0.01 0.00 - 0.07 K/uL  Basic metabolic panel  Result Value Ref Range   Sodium 138 135 - 145 mmol/L   Potassium 4.1 3.5 - 5.1 mmol/L   Chloride 104 98 - 111 mmol/L   CO2 26 22 - 32 mmol/L   Glucose, Bld 116 (H) 70 - 99 mg/dL   BUN 13 6 - 20 mg/dL   Creatinine, Ser 7.82 0.44 - 1.00 mg/dL   Calcium 8.8 (L) 8.9 - 10.3 mg/dL   GFR, Estimated >95 >62 mL/min   Anion gap 8 5 - 15  Troponin I (High Sensitivity)  Result Value Ref Range   Troponin I (High Sensitivity) 2 <18 ng/L   DG Chest 2 View  Result Date: 03/24/2023 CLINICAL DATA:  Cough and congestion. EXAM: CHEST - 2 VIEW COMPARISON:  None Available. FINDINGS: The heart size and mediastinal contours are within normal limits. Both lungs are clear. The visualized skeletal structures are unremarkable. IMPRESSION: No active cardiopulmonary disease. Electronically Signed   By: Aram Candela M.D.   On: 03/24/2023 21:25    EKG EKG Interpretation  Date/Time:  Tuesday Mar 24 2023 21:03:12 EDT Ventricular Rate:  101 PR Interval:  143 QRS Duration: 88 QT Interval:  354 QTC Calculation: 459 R Axis:   75 Text Interpretation: Sinus tachycardia Low voltage, precordial leads Borderline  repolarization abnormality Confirmed by Ernie Avena ([id number]) on 03/24/2023 9:28:36 PM  Radiology DG Chest 2 View  Result Date: 03/24/2023 CLINICAL DATA:  Cough and congestion. EXAM: CHEST - 2 VIEW COMPARISON:  None Available. FINDINGS: The heart size and mediastinal  contours are within normal limits. Both lungs are clear. The visualized skeletal structures are unremarkable. IMPRESSION: No active cardiopulmonary disease. Electronically Signed   By: Aram Candela M.D.   On: 03/24/2023 21:25    Procedures Procedures    Medications Ordered in ED Medications  albuterol (VENTOLIN HFA) 108 (90 Base) MCG/ACT inhaler 2 puff (has no administration in time range)  benzonatate (TESSALON) capsule 200 mg (200 mg Oral Given 03/25/23 0021)  predniSONE (DELTASONE) tablet 60 mg (60 mg Oral Given 03/25/23 0201)  loratadine (CLARITIN) tablet 10 mg (10 mg Oral Given 03/25/23 0202)    ED Course/ Medical Decision Making/ A&P                             Medical Decision Making Patient with URI since Thursday taking mucinex and now robitussin   Amount and/or Complexity of Data Reviewed Independent Historian: spouse    Details: See above  External Data Reviewed: notes.    Details: Previous notes reviewed  Labs: ordered.    Details: Negative troponin 2, negative covid, normal white count 6.8, normal hemoglobin 12.9, normal platelets, normal sodium 138, normal potassium 4.1, normal creatinine .79  Radiology: ordered and independent interpretation performed.    Details: Negative CXR by me  ECG/medicine tests: ordered and independent interpretation performed. Decision-making details documented in ED Course.  Risk OTC drugs. Prescription drug management. Risk Details: Ruled out for MI based on time course.  Patient's symptoms are consistent with URI.  Will start Nasonex, and steroids for symptomatically relief.  Continue Mucinex.  No PNA on CXR.  Stable for discharge.  Strict return.      Final Clinical  Impression(s) / ED Diagnoses Final diagnoses:  Viral URI with cough   Return for intractable cough, coughing up blood, fevers > 100.4 unrelieved by medication, shortness of breath, intractable vomiting, chest pain, shortness of breath, weakness, numbness, changes in speech, facial asymmetry, abdominal pain, passing out, Inability to tolerate liquids or food, cough, altered mental status or any concerns. No signs of systemic illness or infection. The patient is nontoxic-appearing on exam and vital signs are within normal limits.  I have reviewed the triage vital signs and the nursing notes. Pertinent labs & imaging results that were available during my care of the patient were reviewed by me and considered in my medical decision making (see chart for details). After history, exam, and medical workup I feel the patient has been appropriately medically screened and is safe for discharge home. Pertinent diagnoses were discussed with the patient. Patient was given return precautions.  Rx / DC Orders ED Discharge Orders          Ordered    mometasone (NASONEX) 50 MCG/ACT nasal spray  Daily        03/25/23 0207    predniSONE (DELTASONE) 10 MG tablet  Daily        03/25/23 0207              Kimari Lienhard, MD 03/25/23 1610

## 2023-04-14 ENCOUNTER — Ambulatory Visit (INDEPENDENT_AMBULATORY_CARE_PROVIDER_SITE_OTHER): Payer: 59

## 2023-04-14 DIAGNOSIS — Z1231 Encounter for screening mammogram for malignant neoplasm of breast: Secondary | ICD-10-CM

## 2023-04-15 DIAGNOSIS — Z1231 Encounter for screening mammogram for malignant neoplasm of breast: Secondary | ICD-10-CM

## 2023-12-08 IMAGING — MG MM DIGITAL SCREENING BILAT W/ TOMO AND CAD
6 of 12 series · 6 of 36 positions shown · non-contrast
Comparison: Previous exam(s).

CLINICAL DATA: Screening.

EXAM:
DIGITAL SCREENING BILATERAL MAMMOGRAM WITH TOMOSYNTHESIS AND CAD
TECHNIQUE: Bilateral screening digital craniocaudal and mediolateral oblique
mammograms were obtained. Bilateral screening digital breast
tomosynthesis was performed. The images were evaluated with
computer-aided detection.

[R XCCL synth-2D]
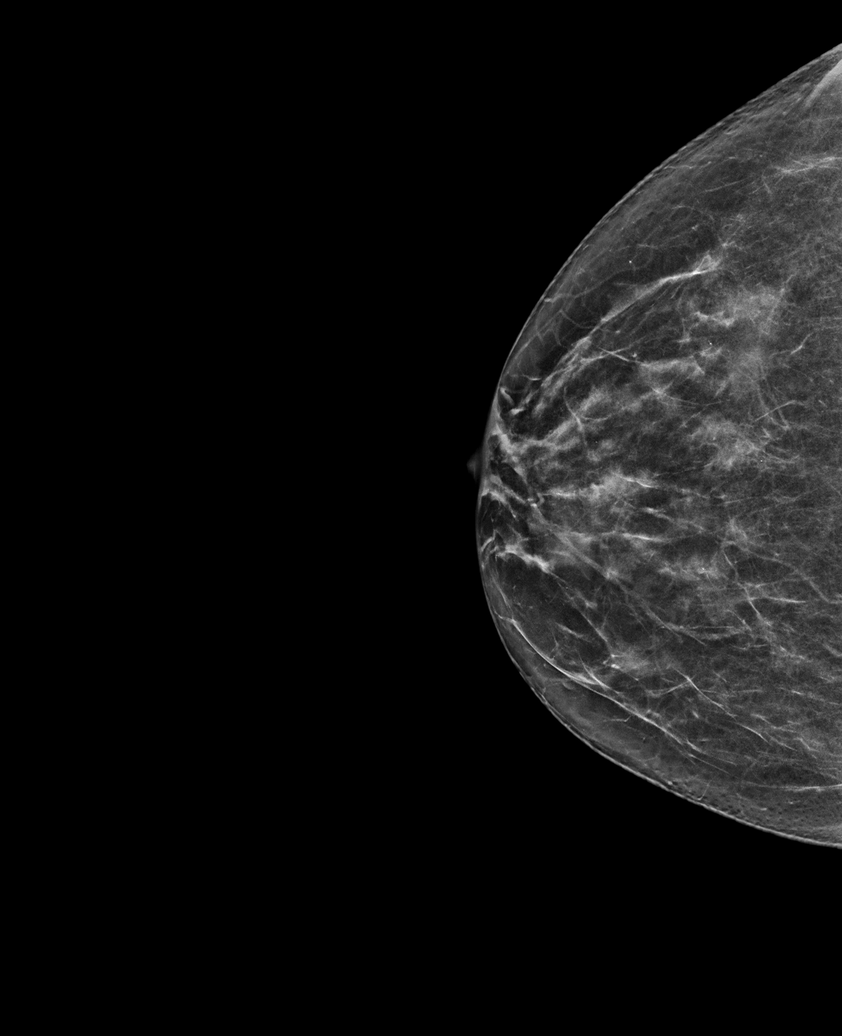

[R CC synth-2D]
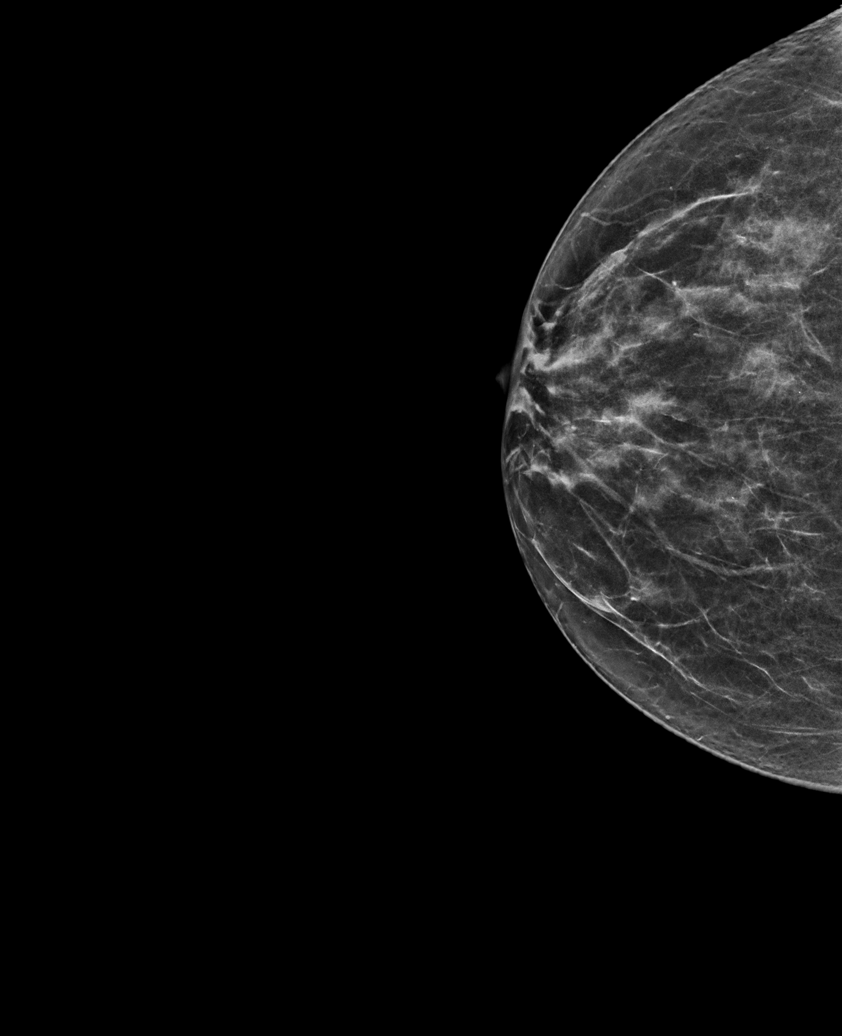

[R MLO synth-2D]
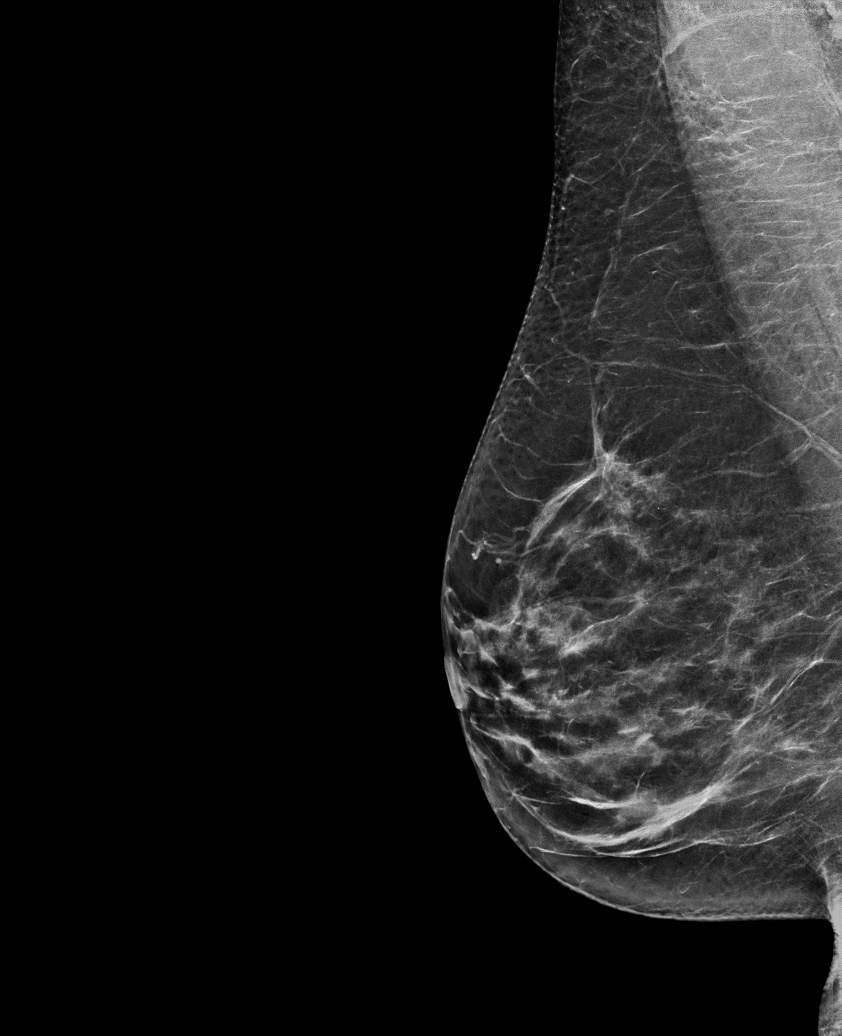

[L CC synth-2D]
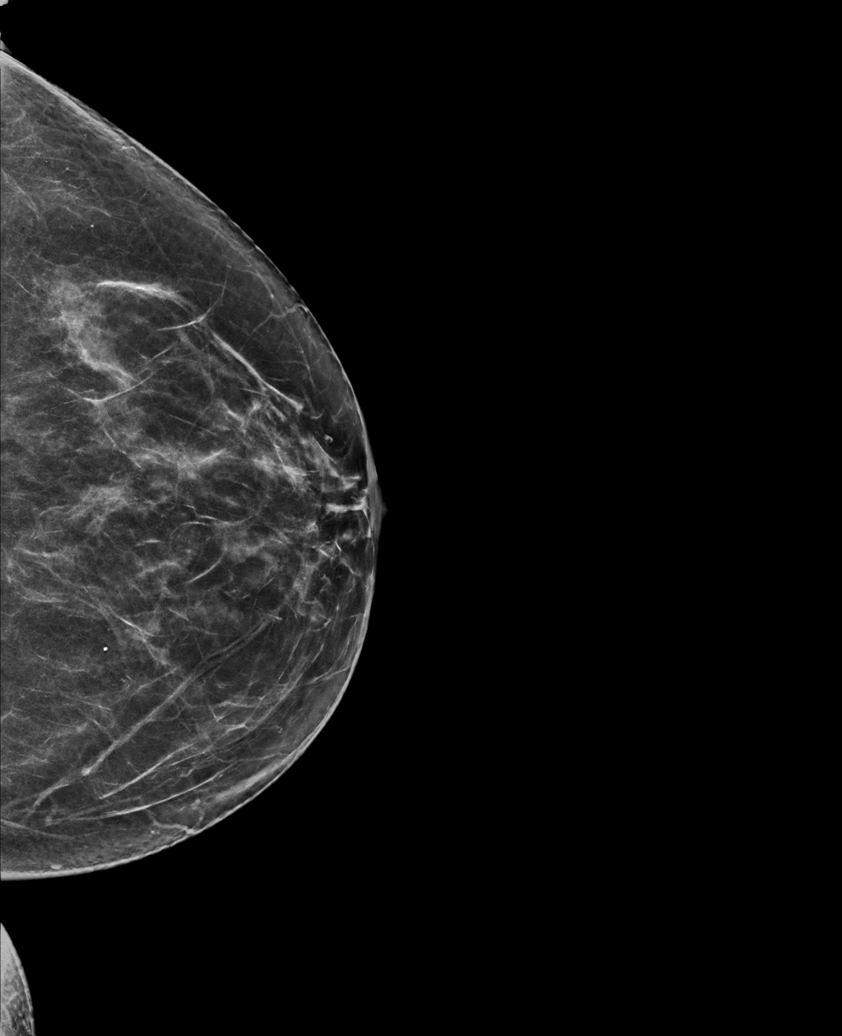

[L MLO synth-2D (1 of 2)]
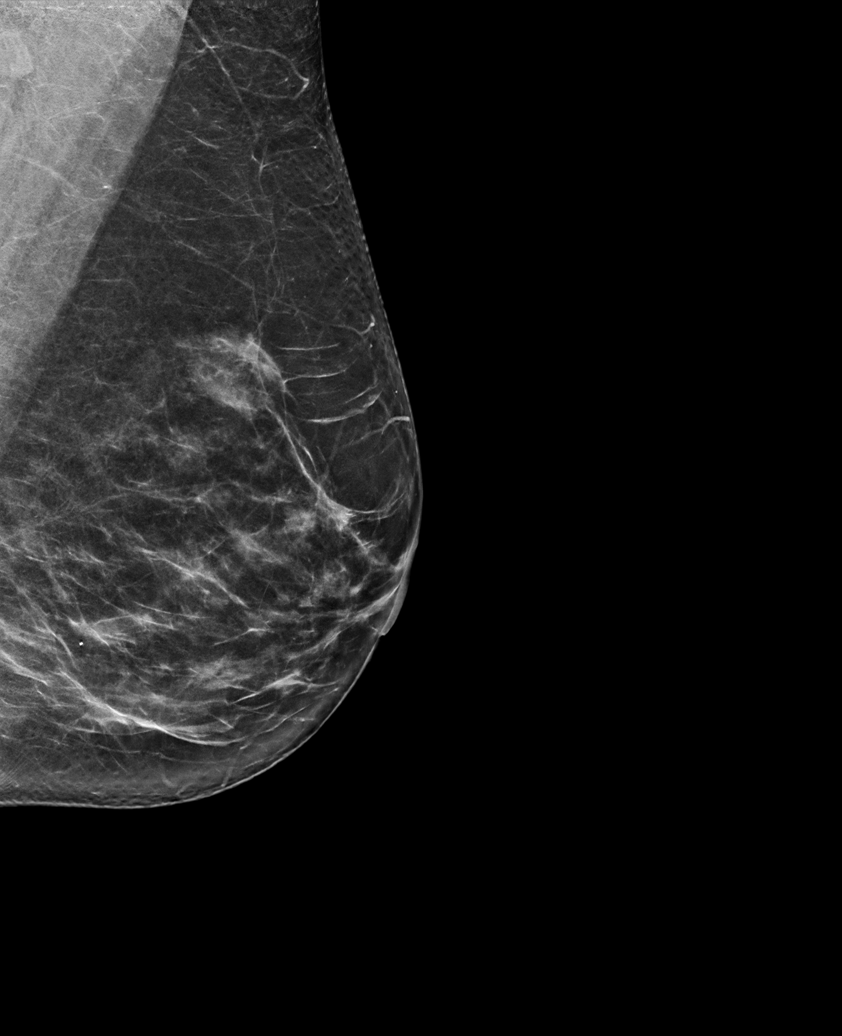

[L MLO synth-2D (2 of 2)]
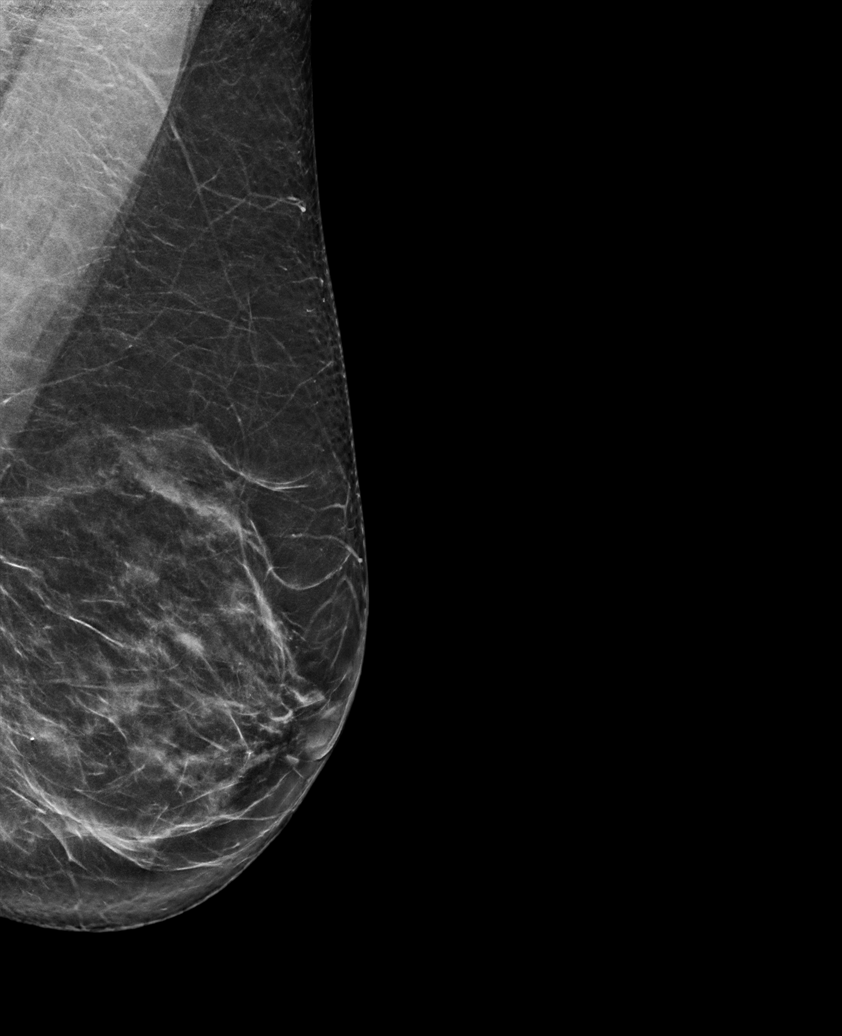

[6 of 36 positions shown; findings below may reference images not displayed]

ACR Breast Density Category b: There are scattered areas of
fibroglandular density.
FINDINGS: There are no findings suspicious for malignancy.
IMPRESSION: No mammographic evidence of malignancy. A result letter of this
screening mammogram will be mailed directly to the patient.

RECOMMENDATION:
Screening mammogram in one year. (Code:51-O-LD2)

BI-RADS CATEGORY  1: Negative.

## 2024-03-29 ENCOUNTER — Other Ambulatory Visit: Payer: Self-pay | Admitting: Obstetrics and Gynecology

## 2024-03-29 DIAGNOSIS — Z1231 Encounter for screening mammogram for malignant neoplasm of breast: Secondary | ICD-10-CM

## 2024-04-14 ENCOUNTER — Encounter

## 2024-04-14 DIAGNOSIS — Z1231 Encounter for screening mammogram for malignant neoplasm of breast: Secondary | ICD-10-CM

## 2024-05-04 ENCOUNTER — Encounter

## 2024-05-09 ENCOUNTER — Encounter (HOSPITAL_BASED_OUTPATIENT_CLINIC_OR_DEPARTMENT_OTHER): Payer: Self-pay

## 2024-05-09 ENCOUNTER — Ambulatory Visit (HOSPITAL_BASED_OUTPATIENT_CLINIC_OR_DEPARTMENT_OTHER)
Admission: RE | Admit: 2024-05-09 | Discharge: 2024-05-09 | Disposition: A | Discharge status: Home / Self Care | Source: Ambulatory Visit | Attending: Obstetrics and Gynecology | Admitting: Obstetrics and Gynecology

## 2024-05-09 DIAGNOSIS — Z1231 Encounter for screening mammogram for malignant neoplasm of breast: Secondary | ICD-10-CM | POA: Diagnosis present
